# Patient Record
Sex: Female | Born: 1980 | Race: White | Hispanic: No | Marital: Married | Smoking: Never smoker
Health system: Southern US, Community
[De-identification: ages and names within clinical notes are randomized; demographics above are authoritative.]

## PROBLEM LIST (undated history)

## (undated) DIAGNOSIS — R569 Unspecified convulsions: Secondary | ICD-10-CM

---

## 2002-11-05 ENCOUNTER — Other Ambulatory Visit: Admission: RE | Admit: 2002-11-05 | Discharge: 2002-11-05 | Payer: Self-pay | Admitting: *Deleted

## 2002-12-16 ENCOUNTER — Emergency Department (HOSPITAL_COMMUNITY): Admission: AD | Admit: 2002-12-16 | Discharge: 2002-12-16 | Payer: Self-pay | Admitting: Family Medicine

## 2002-12-16 ENCOUNTER — Encounter: Payer: Self-pay | Admitting: Internal Medicine

## 2002-12-24 ENCOUNTER — Emergency Department (HOSPITAL_COMMUNITY): Admission: AD | Admit: 2002-12-24 | Discharge: 2002-12-24 | Payer: Self-pay | Admitting: Family Medicine

## 2015-12-06 ENCOUNTER — Encounter (HOSPITAL_COMMUNITY): Payer: Self-pay | Admitting: Emergency Medicine

## 2015-12-06 ENCOUNTER — Emergency Department (HOSPITAL_COMMUNITY): Payer: No Typology Code available for payment source

## 2015-12-06 ENCOUNTER — Emergency Department (HOSPITAL_COMMUNITY)
Admission: EM | Admit: 2015-12-06 | Discharge: 2015-12-06 | Disposition: A | Discharge status: Home / Self Care | Payer: No Typology Code available for payment source | Attending: Emergency Medicine | Admitting: Emergency Medicine

## 2015-12-06 DIAGNOSIS — Y92410 Unspecified street and highway as the place of occurrence of the external cause: Secondary | ICD-10-CM | POA: Diagnosis not present

## 2015-12-06 DIAGNOSIS — R51 Headache: Secondary | ICD-10-CM | POA: Insufficient documentation

## 2015-12-06 DIAGNOSIS — Y999 Unspecified external cause status: Secondary | ICD-10-CM | POA: Insufficient documentation

## 2015-12-06 DIAGNOSIS — M25552 Pain in left hip: Secondary | ICD-10-CM | POA: Insufficient documentation

## 2015-12-06 DIAGNOSIS — S199XXA Unspecified injury of neck, initial encounter: Secondary | ICD-10-CM | POA: Diagnosis present

## 2015-12-06 DIAGNOSIS — S161XXA Strain of muscle, fascia and tendon at neck level, initial encounter: Secondary | ICD-10-CM

## 2015-12-06 DIAGNOSIS — Y9389 Activity, other specified: Secondary | ICD-10-CM | POA: Insufficient documentation

## 2015-12-06 HISTORY — DX: Unspecified convulsions: R56.9

## 2015-12-06 MED ORDER — IBUPROFEN 600 MG PO TABS: 600.0000 mg | ORAL_TABLET | Freq: Three times a day (TID) | ORAL | 0 refills | Status: DC

## 2015-12-06 MED ORDER — CYCLOBENZAPRINE HCL 5 MG PO TABS: 5.0000 mg | ORAL_TABLET | Freq: Two times a day (BID) | ORAL | 0 refills | Status: DC | PRN
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
## 2015-12-06 NOTE — ED Notes (Signed)
PT DISCHARGED. INSTRUCTIONS AND PRESCRIPTIONS GIVEN. AAOX4. PT IN NO APPARENT DISTRESS. THE OPPORTUNITY TO ASK QUESTIONS WAS PROVIDED. 
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
## 2018-05-18 ENCOUNTER — Other Ambulatory Visit: Payer: Self-pay

## 2018-05-18 ENCOUNTER — Ambulatory Visit
Admission: EM | Admit: 2018-05-18 | Discharge: 2018-05-18 | Disposition: A | Discharge status: Home / Self Care | Payer: Medicaid Other | Attending: Family Medicine | Admitting: Family Medicine

## 2018-05-18 ENCOUNTER — Ambulatory Visit: Admit: 2018-05-18 | Discharge status: Absent without leave | Payer: Self-pay

## 2018-05-18 DIAGNOSIS — H6011 Cellulitis of right external ear: Secondary | ICD-10-CM

## 2018-05-18 MED ORDER — FLUCONAZOLE 150 MG PO TABS: 150.0000 mg | ORAL_TABLET | Freq: Once | ORAL | 0 refills | Status: AC

## 2018-05-18 MED ORDER — IBUPROFEN 800 MG PO TABS: 800.0000 mg | ORAL_TABLET | Freq: Three times a day (TID) | ORAL | 0 refills | Status: DC

## 2018-05-18 MED ORDER — CEPHALEXIN 500 MG PO CAPS: 500.0000 mg | ORAL_CAPSULE | Freq: Four times a day (QID) | ORAL | 0 refills | Status: AC

## 2018-05-18 NOTE — ED Provider Notes (Signed)
[name]    CSN: [id number] Arrival date & time: 05/18/18  1226     History   Chief Complaint Chief Complaint  Patient presents with  . Otalgia    HPI Susan Compton is a 38 y.o. female history of seizures presenting today for evaluation of a bump on her right ear.  Patient notes that over the past 2 days she has had increased pain, redness and swelling to her right external ear.  Denies any fevers.  Is concerned about this being infection.  Denies any new piercings.  Does note that she has a new scratch near this area that is been present over the past couple of months.  Has not taken anything for her symptoms.  Denies pain inside the ear.  Denies associated URI symptoms.  HPI  Past Medical History:  Diagnosis Date  . Seizures (HCC)     There are no active problems to display for this patient.   Past Surgical History:  Procedure Laterality Date  . CESAREAN SECTION      OB History   No obstetric history on file.      Home Medications    Prior to Admission medications   Medication Sig Start Date End Date Taking? Authorizing Provider  cephALEXin (KEFLEX) 500 MG capsule Take 1 capsule (500 mg total) by mouth 4 (four) times daily for 7 days. 05/18/18 05/25/18  ,  C, PA-C  cyclobenzaprine (FLEXERIL) 5 MG tablet Take 1 tablet (5 mg total) by mouth 2 (two) times daily as needed for muscle spasms. 12/06/15   Teressa Lower, NP  fluconazole (DIFLUCAN) 150 MG tablet Take 1 tablet (150 mg total) by mouth once for 1 dose. 05/18/18 05/18/18  ,  C, PA-C  ibuprofen (ADVIL,MOTRIN) 800 MG tablet Take 1 tablet (800 mg total) by mouth 3 (three) times daily. 05/18/18   , Junius Creamer, PA-C    Family History No family history on file.  Social History Social History   Tobacco Use  . Smoking status: Never Smoker  . Smokeless tobacco: Never Used  Substance Use Topics  . Alcohol use: No  . Drug use: Not on file     Allergies     Ciprofloxacin   Review of Systems Review of Systems  Constitutional: Negative for activity change, appetite change, chills, fatigue and fever.  HENT: Positive for ear pain. Negative for congestion, rhinorrhea, sinus pressure, sore throat and trouble swallowing.   Eyes: Negative for discharge and redness.  Respiratory: Negative for cough, chest tightness and shortness of breath.   Cardiovascular: Negative for chest pain.  Gastrointestinal: Negative for abdominal pain, diarrhea, nausea and vomiting.  Musculoskeletal: Negative for myalgias.  Skin: Positive for color change. Negative for rash.  Neurological: Negative for dizziness, light-headedness and headaches.     Physical Exam Triage Vital Signs ED Triage Vitals  Enc Vitals Group     BP 05/18/18 1241 136/88     Pulse Rate 05/18/18 1241 88     Resp 05/18/18 1241 16     Temp 05/18/18 1244 (!) 97.5 F (36.4 C)     Temp Source 05/18/18 1244 Oral     SpO2 05/18/18 1241 99 %     Weight 05/18/18 1242 145 lb (65.8 kg)     Height 05/18/18 1242  (1.727 m)     Head Circumference --      Peak Flow --      Pain Score 05/18/18 1241 4     Pain Loc --  Pain Edu? --      Excl. in GC? --    No data found.  Updated Vital Signs BP 136/88 (BP Location: Left Arm)   Pulse 88   Temp (!) 97.5 F (36.4 C) (Oral)   Resp 16   Ht 5\' 8"  (1.727 m)   Wt 145 lb (65.8 kg)   LMP 05/04/2018 (Exact Date)   SpO2 99%   BMI 22.05 kg/m   Visual Acuity Right Eye Distance:   Left Eye Distance:   Bilateral Distance:    Right Eye Near:   Left Eye Near:    Bilateral Near:     Physical Exam Vitals signs and nursing note reviewed.  Constitutional:      General: She is not in acute distress.    Appearance: She is well-developed.  HENT:     Head: Normocephalic and atraumatic.     Ears:      Comments: Bilateral TMs nonerythematous, canals without erythema or swelling  External auricle with swelling erythema, tenderness and increased  warmth and area noted above    Mouth/Throat:     Comments: Oral mucosa pink and moist, no tonsillar enlargement or exudate. Posterior pharynx patent and nonerythematous, no uvula deviation or swelling. Normal phonation.  Eyes:     Conjunctiva/sclera: Conjunctivae normal.  Neck:     Musculoskeletal: Neck supple.  Cardiovascular:     Rate and Rhythm: Normal rate and regular rhythm.     Heart sounds: No murmur.  Pulmonary:     Effort: Pulmonary effort is normal. No respiratory distress.     Breath sounds: Normal breath sounds.     Comments: Breathing comfortably at rest, CTABL, no wheezing, rales or other adventitious sounds auscultated Abdominal:     Palpations: Abdomen is soft.     Tenderness: There is no abdominal tenderness.  Skin:    General: Skin is warm and dry.  Neurological:     Mental Status: She is alert.      UC Treatments / Results  Labs (all labs ordered are listed, but only abnormal results are displayed) Labs Reviewed - No data to display  EKG None  Radiology No results found.  Procedures Procedures (including critical care time)  Medications Ordered in UC Medications - No data to display  Initial Impression / Assessment and Plan / UC Course  I have reviewed the triage vital signs and the nursing notes.  Pertinent labs & imaging results that were available during my care of the patient were reviewed by me and considered in my medical decision making (see chart for details).     Area on ear concerning for cellulitis versus early abscess, no fluctuance at this time, will initiate on antibiotic therapy with Keflex.  Also recommend anti-inflammatories for pain and swelling, warm compresses.  Provided Diflucan per patient request in case developing yeast infection secondary to antibiotics.  Continue to monitor,Discussed strict return precautions. Patient verbalized understanding and is agreeable with plan.  Final Clinical Impressions(s) / UC Diagnoses    Final diagnoses:  Cellulitis of auricle of right ear     Discharge Instructions     Begin Keflex 4 times a day for the next week Use anti-inflammatories for pain/swelling. You may take up to 800 mg Ibuprofen every 8 hours with food. You may supplement Ibuprofen with Tylenol [phone] mg every 8 hours.  Apply warm compresses to help with pain and swelling Diflucan prescribed if needed  Follow-up if symptoms not resolving, worsening, developing increased swelling pain or  drainage   ED Prescriptions    Medication Sig Dispense Auth. Provider   cephALEXin (KEFLEX) 500 MG capsule Take 1 capsule (500 mg total) by mouth 4 (four) times daily for 7 days. 28 capsule ,  C, PA-C   ibuprofen (ADVIL,MOTRIN) 800 MG tablet Take 1 tablet (800 mg total) by mouth 3 (three) times daily. 21 tablet ,  C, PA-C   fluconazole (DIFLUCAN) 150 MG tablet Take 1 tablet (150 mg total) by mouth once for 1 dose. 2 tablet ,  C, PA-C     Controlled Substance Prescriptions [place] Controlled Substance Registry consulted? Not Applicable   Lew Dawes, [place] 05/18/18 1438

## 2018-05-18 NOTE — ED Triage Notes (Signed)
Per pt she noticed a bump on her right ear two days ago and has gotten bigger and has swollen more. Tried to pop it yesterday with no success. No fever nor chills. Painful to touch.

## 2018-05-18 NOTE — Discharge Instructions (Signed)
Begin Keflex 4 times a day for the next week Use anti-inflammatories for pain/swelling. You may take up to 800 mg Ibuprofen every 8 hours with food. You may supplement Ibuprofen with Tylenol [phone] mg every 8 hours.  Apply warm compresses to help with pain and swelling Diflucan prescribed if needed  Follow-up if symptoms not resolving, worsening, developing increased swelling pain or drainage

## 2020-03-20 ENCOUNTER — Ambulatory Visit: Payer: Self-pay

## 2020-04-06 ENCOUNTER — Ambulatory Visit (INDEPENDENT_AMBULATORY_CARE_PROVIDER_SITE_OTHER): Payer: Non-veteran care

## 2020-04-06 ENCOUNTER — Ambulatory Visit
Admission: EM | Admit: 2020-04-06 | Discharge: 2020-04-06 | Disposition: A | Discharge status: Home / Self Care | Payer: Non-veteran care

## 2020-04-06 ENCOUNTER — Other Ambulatory Visit: Payer: Self-pay

## 2020-04-06 DIAGNOSIS — S42032A Displaced fracture of lateral end of left clavicle, initial encounter for closed fracture: Secondary | ICD-10-CM

## 2020-04-06 DIAGNOSIS — M25512 Pain in left shoulder: Secondary | ICD-10-CM

## 2020-04-06 DIAGNOSIS — W19XXXA Unspecified fall, initial encounter: Secondary | ICD-10-CM

## 2020-04-06 NOTE — ED Provider Notes (Signed)
[name]    CSN: [id number] Arrival date & time: 04/06/20  1112      History   Chief Complaint Chief Complaint  Patient presents with  . Fall  . Shoulder Injury    HPI Susan Compton is a 40 y.o. female  With history as below presenting for right clavicle and shoulder pain.  States that she tripped over her dog last night and landed on the hardwood floors.  No head trauma, LOC.  States she is able to move her shoulder, though causes pain.  Denies distal upper extremity numbness, weakness.  Past Medical History:  Diagnosis Date  . Seizures (HCC)     There are no problems to display for this patient.   Past Surgical History:  Procedure Laterality Date  . CESAREAN SECTION      OB History   No obstetric history on file.      Home Medications    Prior to Admission medications   Medication Sig Start Date End Date Taking? Authorizing Provider  ALPRAZolam Prudy Feeler) 1 MG tablet Take 1 mg by mouth 3 (three) times daily as needed for anxiety.   Yes [provider]  gabapentin (NEURONTIN) 600 MG tablet Take 600 mg by mouth 2 (two) times daily.   Yes [provider]  levETIRAcetam (KEPPRA) 750 MG tablet Take 750 mg by mouth 2 (two) times daily.   Yes [provider]  vortioxetine HBr (TRINTELLIX) 20 MG TABS tablet Take 20 mg by mouth daily.   Yes [provider]    Family History History reviewed. No pertinent family history.  Social History Social History   Tobacco Use  . Smoking status: Never Smoker  . Smokeless tobacco: Never Used  Substance Use Topics  . Alcohol use: No     Allergies   Morphine and related and Ciprofloxacin   Review of Systems Review of Systems  Constitutional: Negative for fatigue and fever.  HENT: Negative for ear pain, sinus pain, sore throat and voice change.   Eyes: Negative for pain, redness and visual disturbance.  Respiratory: Negative for cough and shortness of breath.    Cardiovascular: Negative for chest pain and palpitations.  Gastrointestinal: Negative for abdominal pain, diarrhea and vomiting.  Musculoskeletal: Negative for arthralgias and myalgias.       Positive for right shoulder and clavicular pain  Skin: Negative for rash and wound.  Neurological: Negative for syncope and headaches.     Physical Exam Triage Vital Signs ED Triage Vitals  Enc Vitals Group     BP 04/06/20 1304 122/83     Pulse Rate 04/06/20 1304 72     Resp 04/06/20 1304 18     Temp 04/06/20 1304 98.3 F (36.8 C)     Temp Source 04/06/20 1304 Oral     SpO2 04/06/20 1304 97 %     Weight --      Height --      Head Circumference --      Peak Flow --      Pain Score 04/06/20 1305 6     Pain Loc --      Pain Edu? --      Excl. in GC? --    No data found.  Updated Vital Signs BP 122/83 (BP Location: Left Arm)   Pulse 72   Temp 98.3 F (36.8 C) (Oral)   Resp 18   SpO2 97%   Visual Acuity Right Eye Distance:   Left Eye Distance:  Bilateral Distance:    Right Eye Near:   Left Eye Near:    Bilateral Near:     Physical Exam Constitutional:      General: She is not in acute distress. HENT:     Head: Normocephalic and atraumatic.  Eyes:     General: No scleral icterus.    Pupils: Pupils are equal, round, and reactive to light.  Cardiovascular:     Rate and Rhythm: Normal rate.  Pulmonary:     Effort: Pulmonary effort is normal.  Musculoskeletal:        General: Swelling and tenderness present. No deformity.     Comments: Decreased abduction, extension secondary pain.  No obvious deformity.  Patient does have exquisite TTP over distal clavicle without appreciable deformity or crepitus.  Distal extremity NVI  Skin:    Coloration: Skin is not jaundiced or pale.     Findings: Bruising present.  Neurological:     Mental Status: She is alert and oriented to person, place, and time.      UC Treatments / Results  Labs (all labs ordered are listed, but  only abnormal results are displayed) Labs Reviewed - No data to display  EKG   Radiology DG Shoulder Left  Result Date: 04/06/2020 CLINICAL DATA:  Tripped over dog last night and landed on LEFT shoulder, unable to bring arm far from body EXAM: LEFT SHOULDER - 2+ VIEW COMPARISON:  None FINDINGS: Osseous mineralization low normal. AC joint alignment normal. Minimally displaced fracture at distal LEFT clavicle. No additional fracture, dislocation, or bone destruction. Visualized LEFT ribs intact. IMPRESSION: Minimally displaced distal LEFT clavicular fracture. Electronically Signed   By: Ulyses Southward M.D.   On: 04/06/2020 13:34    Procedures Procedures (including critical care time)  Medications Ordered in UC Medications - No data to display  Initial Impression / Assessment and Plan / UC Course  I have reviewed the triage vital signs and the nursing notes.  Pertinent labs & imaging results that were available during my care of the patient were reviewed by me and considered in my medical decision making (see chart for details).     X-ray significant for minimally displaced distal left clavicular fracture.  Placed patient in sling, instructed her to practice RICE as below, and follow-up with Ortho within the next week.  Return precautions discussed, pt verbalized understanding and is agreeable to plan. Final Clinical Impressions(s) / UC Diagnoses   Final diagnoses:  Closed displaced fracture of acromial end of left clavicle, initial encounter  Fall, initial encounter   Discharge Instructions   None    ED Prescriptions    None     PDMP not reviewed this encounter.   Hall[place]Potvin, [place], [place] 04/06/20 1539

## 2020-04-06 NOTE — ED Triage Notes (Signed)
Pt states tripped over her dog last night landing on hard wood floors. Pt c/o lt shoulder pain with decrease movement and bruising to both knees. No deformity noted.

## 2020-07-14 ENCOUNTER — Ambulatory Visit
Admission: RE | Admit: 2020-07-14 | Discharge: 2020-07-14 | Disposition: A | Discharge status: Home / Self Care | Payer: No Typology Code available for payment source | Source: Ambulatory Visit | Attending: Family Medicine | Admitting: Family Medicine

## 2020-07-14 ENCOUNTER — Other Ambulatory Visit: Payer: Self-pay

## 2020-07-14 VITALS — BP 112/71 | HR 78 | Temp 98.1°F | Resp 16

## 2020-07-14 DIAGNOSIS — J0100 Acute maxillary sinusitis, unspecified: Secondary | ICD-10-CM

## 2020-07-14 MED ORDER — AMOXICILLIN-POT CLAVULANATE 875-125 MG PO TABS: 1.0000 | ORAL_TABLET | Freq: Two times a day (BID) | ORAL | 0 refills | Status: AC

## 2020-07-14 MED ORDER — PREDNISONE 20 MG PO TABS: 40.0000 mg | ORAL_TABLET | Freq: Every day | ORAL | 0 refills | Status: AC

## 2020-07-14 MED ORDER — PROMETHAZINE-DM 6.25-15 MG/5ML PO SYRP: 5.0000 mL | ORAL_SOLUTION | Freq: Four times a day (QID) | ORAL | 0 refills | Status: AC | PRN

## 2020-07-14 NOTE — ED Provider Notes (Signed)
[name]    CSN: [id number] Arrival date & time: 07/14/20  1125      History   Chief Complaint Chief Complaint  Patient presents with  . appt 12 - cough    HPI Susan Compton is a 40 y.o. female.   Presenting today with 1 week history of productive cough, sinus pain and pressure congestion, shortness of breath, fatigue.  She denies known fever, body aches, chills, sweats, nausea, vomiting, diarrhea, abdominal pain.  Has taken multiple COVID tests since onset which have all been negative.  Denies any pertinent chronic medical problems other than seasonal allergies for which she takes Claritin daily.  Has been taking over-the-counter cough and cold medications and Mucinex with minimal relief of current symptoms.  No known sick personal contacts though does work in the [employer].    Past Medical History:  Diagnosis Date  . Seizures (HCC)     There are no problems to display for this patient.   Past Surgical History:  Procedure Laterality Date  . CESAREAN SECTION      OB History   No obstetric history on file.      Home Medications    Prior to Admission medications   Medication Sig Start Date End Date Taking? Authorizing Provider  amoxicillin-clavulanate (AUGMENTIN) 875-125 MG tablet Take 1 tablet by mouth every 12 (twelve) hours. 07/14/20  Yes Particia Nearing, PA-C  predniSONE (DELTASONE) 20 MG tablet Take 2 tablets (40 mg total) by mouth daily with breakfast. 07/14/20  Yes Particia Nearing, PA-C  promethazine-dextromethorphan (PROMETHAZINE-DM) 6.25-15 MG/5ML syrup Take 5 mLs by mouth 4 (four) times daily as needed for cough. 07/14/20  Yes Particia Nearing, PA-C  ALPRAZolam Prudy Feeler) 1 MG tablet Take 1 mg by mouth 3 (three) times daily as needed for anxiety.    [provider]  gabapentin (NEURONTIN) 600 MG tablet Take 600 mg by mouth 2 (two) times daily.    [provider]  levETIRAcetam (KEPPRA) 750 MG tablet  Take 750 mg by mouth 2 (two) times daily.    [provider]  vortioxetine HBr (TRINTELLIX) 20 MG TABS tablet Take 20 mg by mouth daily.    [provider]    Family History History reviewed. No pertinent family history.  Social History Social History   Tobacco Use  . Smoking status: Never Smoker  . Smokeless tobacco: Never Used  Substance Use Topics  . Alcohol use: No  . Drug use: Not Currently     Allergies   Morphine and related and Ciprofloxacin   Review of Systems Review of Systems Per HPI  Physical Exam Triage Vital Signs ED Triage Vitals  Enc Vitals Group     BP 07/14/20 1220 112/71     Pulse Rate 07/14/20 1220 78     Resp 07/14/20 1220 16     Temp 07/14/20 1220 98.1 F (36.7 C)     Temp Source 07/14/20 1220 Oral     SpO2 07/14/20 1220 97 %     Weight --      Height --      Head Circumference --      Peak Flow --      Pain Score 07/14/20 1222 3     Pain Loc --      Pain Edu? --      Excl. in GC? --    No data found.  Updated Vital Signs BP 112/71 (BP Location: Left Arm)   Pulse 78  Temp 98.1 F (36.7 C) (Oral)   Resp 16   SpO2 97%   Visual Acuity Right Eye Distance:   Left Eye Distance:   Bilateral Distance:    Right Eye Near:   Left Eye Near:    Bilateral Near:     Physical Exam Vitals and nursing note reviewed.  Constitutional:      Appearance: Normal appearance. She is not ill-appearing.  HENT:     Head: Atraumatic.     Right Ear: Tympanic membrane normal.     Left Ear: Tympanic membrane normal.     Nose: Congestion and rhinorrhea present.     Mouth/Throat:     Mouth: Mucous membranes are moist.     Pharynx: Posterior oropharyngeal erythema present. No oropharyngeal exudate.  Eyes:     Extraocular Movements: Extraocular movements intact.     Conjunctiva/sclera: Conjunctivae normal.  Cardiovascular:     Rate and Rhythm: Normal rate and regular rhythm.     Heart sounds: Normal heart sounds.  Pulmonary:      Effort: Pulmonary effort is normal. No respiratory distress.     Breath sounds: Normal breath sounds. No wheezing or rales.  Chest:     Chest wall: No tenderness.  Abdominal:     General: Bowel sounds are normal. There is no distension.     Palpations: Abdomen is soft.     Tenderness: There is no abdominal tenderness. There is no guarding.  Musculoskeletal:        General: Normal range of motion.     Cervical back: Normal range of motion and neck supple.  Skin:    General: Skin is warm and dry.  Neurological:     Mental Status: She is alert and oriented to person, place, and time.  Psychiatric:        Mood and Affect: Mood normal.        Thought Content: Thought content normal.        Judgment: Judgment normal.      UC Treatments / Results  Labs (all labs ordered are listed, but only abnormal results are displayed) Labs Reviewed - No data to display  EKG   Radiology No results found.  Procedures Procedures (including critical care time)  Medications Ordered in UC Medications - No data to display  Initial Impression / Assessment and Plan / UC Course  I have reviewed the triage vital signs and the nursing notes.  Pertinent labs & imaging results that were available during my care of the patient were reviewed by me and considered in my medical decision making (see chart for details).     Given duration and worsening course, will treat for possible bacterial sinusitis/respiratory infection with Augmentin, prednisone, Phenergan DM.  Continue allergy regimen, Mucinex and supportive home care.  Work note given for rest.  Will defer COVID testing as she has had multiple negative tests including as recently as yesterday up to this point.  Final Clinical Impressions(s) / UC Diagnoses   Final diagnoses:  Acute maxillary sinusitis, recurrence not specified   Discharge Instructions   None    ED Prescriptions    Medication Sig Dispense Auth. Provider   predniSONE  (DELTASONE) 20 MG tablet Take 2 tablets (40 mg total) by mouth daily with breakfast. 10 tablet Particia Nearing, PA-C   amoxicillin-clavulanate (AUGMENTIN) 875-125 MG tablet Take 1 tablet by mouth every 12 (twelve) hours. 14 tablet [name] [place], [place]   promethazine-dextromethorphan (PROMETHAZINE-DM) 6.25-15 MG/5ML syrup Take 5 mLs by  mouth 4 (four) times daily as needed for cough. 100 mL Particia Nearing, [place]     PDMP not reviewed this encounter.   Particia Nearing, [place] 07/14/20 1352

## 2020-07-14 NOTE — ED Triage Notes (Signed)
Pt c/o cough and congestion x1wk. States now having productive cough with brown large sputum with SOB on exertion. States neg covid test at work yesterday.

## 2020-11-24 ENCOUNTER — Ambulatory Visit
Admission: EM | Admit: 2020-11-24 | Discharge: 2020-11-24 | Disposition: A | Discharge status: Home / Self Care | Payer: Medicaid Other | Attending: Internal Medicine | Admitting: Internal Medicine

## 2020-11-24 ENCOUNTER — Other Ambulatory Visit: Payer: Self-pay

## 2020-11-24 ENCOUNTER — Ambulatory Visit: Payer: No Typology Code available for payment source

## 2020-11-24 ENCOUNTER — Encounter: Payer: Self-pay | Admitting: Emergency Medicine

## 2020-11-24 DIAGNOSIS — R059 Cough, unspecified: Secondary | ICD-10-CM

## 2020-11-24 DIAGNOSIS — U071 COVID-19: Secondary | ICD-10-CM

## 2020-11-24 DIAGNOSIS — R110 Nausea: Secondary | ICD-10-CM | POA: Diagnosis not present

## 2020-11-24 MED ORDER — GUAIFENESIN 200 MG PO TABS: 200.0000 mg | ORAL_TABLET | ORAL | 0 refills | Status: AC | PRN

## 2020-11-24 MED ORDER — ONDANSETRON 4 MG PO TBDP: 4.0000 mg | ORAL_TABLET | Freq: Three times a day (TID) | ORAL | 0 refills | Status: AC | PRN

## 2020-11-24 NOTE — ED Provider Notes (Signed)
[name]    CSN: [id number] Arrival date & time: 11/24/20  1446      History   Chief Complaint Chief Complaint  Patient presents with   Cough   Appointment    1500 waiting in car [phone]    HPI Susan Compton is a 40 y.o. female.   Patient presents for further evaluation after testing positive for COVID-19 today while at work.  Patient having nasal congestion, cough, fever, nausea without vomiting that has been present for 1 to 2 days.  T-max at home was 101.  Requesting medications to help alleviate cough and nausea.  Patient has not yet taken any over-the-counter medications to help alleviate symptoms.  Denies any chest pain or shortness of breath.  Per medical history includes epilepsy.    Cough  Past Medical History:  Diagnosis Date   Seizures (HCC)     There are no problems to display for this patient.   Past Surgical History:  Procedure Laterality Date   CESAREAN SECTION      OB History   No obstetric history on file.      Home Medications    Prior to Admission medications   Medication Sig Start Date End Date Taking? Authorizing Provider  guaiFENesin 200 MG tablet Take 1 tablet (200 mg total) by mouth every 4 (four) hours as needed for cough or to loosen phlegm. 11/24/20  Yes Lance Muss, FNP  ondansetron (ZOFRAN-ODT) 4 MG disintegrating tablet Take 1 tablet (4 mg total) by mouth every 8 (eight) hours as needed for nausea or vomiting. 11/24/20  Yes Lance Muss, FNP  ALPRAZolam Prudy Feeler) 1 MG tablet Take 1 mg by mouth 3 (three) times daily as needed for anxiety.    [provider]  amoxicillin-clavulanate (AUGMENTIN) 875-125 MG tablet Take 1 tablet by mouth every 12 (twelve) hours. Patient not taking: Reported on 11/24/2020 07/14/20   Particia Nearing, PA-C  gabapentin (NEURONTIN) 600 MG tablet Take 600 mg by mouth 2 (two) times daily.    [provider]  levETIRAcetam (KEPPRA) 750 MG tablet Take 750 mg  by mouth 2 (two) times daily.    [provider]  predniSONE (DELTASONE) 20 MG tablet Take 2 tablets (40 mg total) by mouth daily with breakfast. Patient not taking: Reported on 11/24/2020 07/14/20   Particia Nearing, PA-C  promethazine-dextromethorphan (PROMETHAZINE-DM) 6.25-15 MG/5ML syrup Take 5 mLs by mouth 4 (four) times daily as needed for cough. Patient not taking: Reported on 11/24/2020 07/14/20   Particia Nearing, PA-C  vortioxetine HBr (TRINTELLIX) 20 MG TABS tablet Take 20 mg by mouth daily.    [provider]    Family History History reviewed. No pertinent family history.  Social History Social History   Tobacco Use   Smoking status: Never   Smokeless tobacco: Never  Substance Use Topics   Alcohol use: No   Drug use: Not Currently     Allergies   Morphine and related, Ibuprofen, and Ciprofloxacin   Review of Systems Review of Systems Per HPI  Physical Exam Triage Vital Signs ED Triage Vitals  Enc Vitals Group     BP 11/24/20 1512 124/85     Pulse Rate 11/24/20 1512 96     Resp 11/24/20 1512 18     Temp 11/24/20 1512 99.9 F (37.7 C)     Temp Source 11/24/20 1512 Oral     SpO2 11/24/20 1512 96 %     Weight --  Height --      Head Circumference --      Peak Flow --      Pain Score 11/24/20 1447 6     Pain Loc --      Pain Edu? --      Excl. in GC? --    No data found.  Updated Vital Signs BP 124/85 (BP Location: Left Arm)   Pulse 96   Temp 99.9 F (37.7 C) (Oral)   Resp 18   SpO2 96%   Visual Acuity Right Eye Distance:   Left Eye Distance:   Bilateral Distance:    Right Eye Near:   Left Eye Near:    Bilateral Near:     Physical Exam Constitutional:      General: She is not in acute distress.    Appearance: Normal appearance.  HENT:     Head: Normocephalic and atraumatic.     Right Ear: Tympanic membrane and ear canal normal.     Left Ear: Tympanic membrane and ear canal normal.     Nose: Congestion  present.     Mouth/Throat:     Mouth: Mucous membranes are moist.     Pharynx: No posterior oropharyngeal erythema.  Eyes:     Extraocular Movements: Extraocular movements intact.     Conjunctiva/sclera: Conjunctivae normal.     Pupils: Pupils are equal, round, and reactive to light.  Cardiovascular:     Rate and Rhythm: Normal rate and regular rhythm.     Pulses: Normal pulses.     Heart sounds: Normal heart sounds.  Pulmonary:     Effort: Pulmonary effort is normal. No respiratory distress.     Breath sounds: Normal breath sounds. No stridor. No wheezing or rhonchi.  Abdominal:     General: Abdomen is flat. Bowel sounds are normal.     Palpations: Abdomen is soft.  Musculoskeletal:        General: Normal range of motion.     Cervical back: Normal range of motion.  Skin:    General: Skin is warm and dry.  Neurological:     General: No focal deficit present.     Mental Status: She is alert and oriented to person, place, and time. Mental status is at baseline.  Psychiatric:        Mood and Affect: Mood normal.        Behavior: Behavior normal.     UC Treatments / Results  Labs (all labs ordered are listed, but only abnormal results are displayed) Labs Reviewed - No data to display  EKG   Radiology No results found.  Procedures Procedures (including critical care time)  Medications Ordered in UC Medications - No data to display  Initial Impression / Assessment and Plan / UC Course  I have reviewed the triage vital signs and the nursing notes.  Pertinent labs & imaging results that were available during my care of the patient were reviewed by me and considered in my medical decision making (see chart for details).     Patient presents with symptoms  from a viral upper respiratory infection that is Covid 19. Do not suspect underlying cardiopulmonary process. Symptoms seem unlikely related to ACS, CHF or COPD exacerbations, pneumonia, pneumothorax. Patient is  nontoxic appearing and not in need of emergent medical intervention.  Patient has to be careful with medications to alleviate cough and congestion due to lowering seizure threshold.  Advised patient to avoid medications that end in D, DM, or Sudafed.  Patient was prescribed guaifenesin to help with cough.  Also prescribed ondansetron to help with nausea.  Patient was offered COVID retesting to confirm but patient declined.  Return if symptoms fail to improve in 1-2 weeks or you develop shortness of breath, chest pain, severe headache. Patient states understanding and is agreeable.  Discharged with PCP followup.  Final Clinical Impressions(s) / UC Diagnoses   Final diagnoses:  COVID-19  Cough  Nausea without vomiting     Discharge Instructions      You have been prescribed guaifenesin for cough and ondansetron to take as needed for nausea.  Please avoid medications that end in D or DM as well as Sudafed as these may lower your seizure threshold.  Other medications that cause drowsiness may also lower your seizure threshold.  This cough medication is the safest for you to take. Please limit use of ondansetron as well.      ED Prescriptions     Medication Sig Dispense Auth. Provider   guaiFENesin 200 MG tablet Take 1 tablet (200 mg total) by mouth every 4 (four) hours as needed for cough or to loosen phlegm. 30 suppository Henrene Dodge E, FNP   ondansetron (ZOFRAN-ODT) 4 MG disintegrating tablet Take 1 tablet (4 mg total) by mouth every 8 (eight) hours as needed for nausea or vomiting. 20 tablet Lance Muss, FNP      PDMP not reviewed this encounter.   Lance Muss, FNP 11/24/20 1550

## 2020-11-24 NOTE — ED Triage Notes (Signed)
Pt here after testing positive for covid today at work; pt requesting work note and meds for cough and nausea

## 2020-11-24 NOTE — Discharge Instructions (Addendum)
You have been prescribed guaifenesin for cough and ondansetron to take as needed for nausea.  Please avoid medications that end in D or DM as well as Sudafed as these may lower your seizure threshold.  Other medications that cause drowsiness may also lower your seizure threshold.  This cough medication is the safest for you to take. Please limit use of ondansetron as well.
